# Patient Record
Sex: Male | Born: 1975 | Race: White | Hispanic: No | Marital: Married | Smoking: Never smoker
Health system: Southern US, Community
[De-identification: ages and names within clinical notes are randomized; demographics above are authoritative.]

## PROBLEM LIST (undated history)

## (undated) DIAGNOSIS — Z87442 Personal history of urinary calculi: Secondary | ICD-10-CM

---

## 2017-02-14 ENCOUNTER — Ambulatory Visit (INDEPENDENT_AMBULATORY_CARE_PROVIDER_SITE_OTHER): Payer: BLUE CROSS/BLUE SHIELD | Admitting: Orthopaedic Surgery

## 2017-02-14 ENCOUNTER — Encounter (INDEPENDENT_AMBULATORY_CARE_PROVIDER_SITE_OTHER): Payer: Self-pay | Admitting: Orthopaedic Surgery

## 2017-02-14 ENCOUNTER — Ambulatory Visit (INDEPENDENT_AMBULATORY_CARE_PROVIDER_SITE_OTHER): Payer: BLUE CROSS/BLUE SHIELD

## 2017-02-14 VITALS — BP 151/104 | HR 77

## 2017-02-14 DIAGNOSIS — M5441 Lumbago with sciatica, right side: Secondary | ICD-10-CM | POA: Diagnosis not present

## 2017-02-14 NOTE — Progress Notes (Addendum)
Office Visit Note   Patient: Nathan Cortez           Date of Birth: 1975/06/25           MRN: [id number] Visit Date: 02/14/2017              Requested by: No referring provider defined for this encounter. PCP: Nathan MartinsHubbard, Geramy A, MD   Assessment & Plan: Visit Diagnoses:  1. Acute back pain with sciatica, right     Plan: Patient has back pain with radiculopathy on the right involving the S1 nerve root.  We will place him on Cortez prednisone Dosepak.  Work slip given no work times 2 weeks I plan to check him in 2 weeks.  Follow-Up Instructions: Return in about 2 weeks (around 02/28/2017).   Orders:  Orders Placed This Encounter  Procedures  . XR Lumbar Spine 2-3 Views   No orders of the defined types were placed in this encounter.     Procedures: No procedures performed   Clinical Data: No additional findings.   Subjective: Chief Complaint  Patient presents with  . Lower Back - Pain    HPI 41 year old male [place] with onset of severe back pain right leg pain and weakness that started Thursday evening.  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  Patient states his leg is extremely painful week he has severe pain when he sits he is not able to walk on his toes right foot.  Patient has had some problems with intermittent back aching and soreness but is not had any leg weakness like this since  2013.  Review of Systems  Constitutional: Negative for chills and diaphoresis.  HENT: Negative for ear discharge, ear pain and nosebleeds.   Eyes: Negative for discharge and visual disturbance.  Respiratory: Negative for cough, choking and shortness of breath.   Cardiovascular: Negative for chest pain and palpitations.  Gastrointestinal: Negative for  abdominal distention and abdominal pain.  Endocrine: Negative for cold intolerance and heat intolerance.  Genitourinary: Negative for flank pain and hematuria.  Skin: Negative for rash and wound.  Neurological: Negative for seizures and speech difficulty.  Hematological: Negative for adenopathy. Does not bruise/bleed easily.  Psychiatric/Behavioral: Negative for agitation and suicidal ideas.   review of systems positive for bronchitis he is Cortez non-smoker.  Past history of epidural injections back in 2013 x 2 with good relief.  Patient denies any fever or chills no bowel or bladder associated symptoms.   Objective: Vital Signs: BP (!) 151/104   Pulse 77   Physical Exam  Constitutional: He is oriented to person, place, and time. He appears well-developed and well-nourished.  HENT:  Head: Normocephalic and atraumatic.  Eyes: EOM are normal. Pupils are equal, round, and reactive to light.  Neck: No tracheal deviation present. No thyromegaly present.  Cardiovascular: Normal rate.  Pulmonary/Chest: Effort normal. He has no wheezes.  Abdominal: Soft. Bowel sounds are normal.  Neurological: He is alert and oriented to person, place, and time.  Skin: Skin is warm and dry. Capillary refill takes less than 2 seconds.  Psychiatric: He has Cortez normal mood and affect. His behavior is normal. Judgment and thought content normal.    Ortho Exam positive straight leg raising  Right at  30 degrees.  Normal internal/external rotation of his right hip positive sciatic notch tenderness.  He is unable to do Cortez single stance toe raise on the right.  He can heel walk both sides easily negative straight leg raising.  Anterior tib EHL is strong.  Her peroneal is weak on the right as well as gastrocsoleus without atrophy.  Distal pulses are 2+.  Specialty Comments:  No specialty comments available.  Imaging: Xr Lumbar Spine 2-3 Views  Result Date: 02/14/2017 AP lateral lumbar x-ray obtained and reviewed.  This  shows 2 mm retrolisthesis at L5-S1.  No pars defect negative for acute changes.  Pelvis and hip joints are normal. Impression: Normal alignment other than slight retrolisthesis L5-S1    PMFS History: Patient Active Problem List   Diagnosis Date Noted  . Acute back pain with sciatica, right 02/14/2017   History reviewed. No pertinent past medical history.  History reviewed. No pertinent family history.  History reviewed. No pertinent surgical history. Social History   Occupational History  . Not on file  Tobacco Use  . Smoking status: Never Smoker  . Smokeless tobacco: Never Used  Substance and Sexual Activity  . Alcohol use: Not on file  . Drug use: Not on file  . Sexual activity: Not on file

## 2017-02-20 ENCOUNTER — Telehealth (INDEPENDENT_AMBULATORY_CARE_PROVIDER_SITE_OTHER): Payer: Self-pay | Admitting: Orthopaedic Surgery

## 2017-02-20 DIAGNOSIS — M5441 Lumbago with sciatica, right side: Secondary | ICD-10-CM

## 2017-02-20 NOTE — Telephone Encounter (Signed)
Patient called saying he is still in a lot of pain and was wondering if he should get some more steroid patches or what his other options were. CB # [phone]

## 2017-02-20 NOTE — Telephone Encounter (Signed)
Order entered

## 2017-02-20 NOTE — Telephone Encounter (Signed)
Please advise 

## 2017-02-20 NOTE — Addendum Note (Signed)
Addended by: Rogers SeedsYEATTS, Nicholaos Schippers M on: 02/20/2017 02:23 PM   Modules accepted: Orders

## 2017-02-20 NOTE — Telephone Encounter (Signed)
I called discussed. Still with right calf weakness, cannot walk on right toes.  Proceed with MRI   LBP and right progressive leg weakness S1.   ROV after scan. 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

## 2017-03-01 ENCOUNTER — Ambulatory Visit
Admission: RE | Admit: 2017-03-01 | Discharge: 2017-03-01 | Disposition: A | Discharge status: Home / Self Care | Payer: BLUE CROSS/BLUE SHIELD | Source: Ambulatory Visit | Attending: Orthopaedic Surgery | Admitting: Orthopaedic Surgery

## 2017-03-01 DIAGNOSIS — M5441 Lumbago with sciatica, right side: Secondary | ICD-10-CM

## 2017-03-08 ENCOUNTER — Ambulatory Visit (INDEPENDENT_AMBULATORY_CARE_PROVIDER_SITE_OTHER): Payer: BLUE CROSS/BLUE SHIELD | Admitting: Orthopaedic Surgery

## 2017-03-08 ENCOUNTER — Encounter (INDEPENDENT_AMBULATORY_CARE_PROVIDER_SITE_OTHER): Payer: Self-pay | Admitting: Orthopaedic Surgery

## 2017-03-08 VITALS — BP 145/103 | HR 74

## 2017-03-08 DIAGNOSIS — M5126 Other intervertebral disc displacement, lumbar region: Secondary | ICD-10-CM

## 2017-03-08 NOTE — Progress Notes (Signed)
Office Visit Note   Patient: Nathan Cortez           Date of Birth: [birth year]           MRN: [id number] Visit Date: 03/08/2017              Requested by: Waneta MartinsHubbard, Ra A, MD [address] [place], [place] [address] PCP: Waneta MartinsHubbard, Jeramiah A, MD   Assessment & Plan: Visit Diagnoses:  1. Protrusion of lumbar intervertebral disc     Plan: We will set him up for an epidural injection on the right at the bottom 2 lumbar regions where he is having disc protrusion.  We discussed the facet arthropathy present at the bottom 2 levels in the mild stenosis that is present.  Work slip given for continued out of work and I will recheck him after the epidural injections.  Pathophysiology of disc degeneration discussed questions were elicited and answered.  Patient has radiculopathy with S1 nerve compression on the right consistent with his scan and should get good relief with epidural.  Office follow-up after the epidural.  Follow-Up Instructions: No Follow-up on file.   Orders:  No orders of the defined types were placed in this encounter.  No orders of the defined types were placed in this encounter.     Procedures: No procedures performed   Clinical Data: No additional findings.   Subjective: Chief Complaint  Patient presents with  . 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  He has had significant back pain and right leg symptoms for 4 weeks.  His wife came with him today.  He took some DayQuil this morning for cold has some elevation of his blood pressure at 145/103.  Prednisone pack gave him some improvement after about a week.  He thinks he is about 50% improved.  He has remained out of work to do his back and leg symptoms.  Review of Systems view of systems updated unchanged from 02/15/1999 1814 point, otherwise as mentioned in HPI.   Objective: Vital Signs: BP  (!) 145/103   Pulse 74   Physical Exam  Constitutional: He is oriented to person, place, and time. He appears well-developed and well-nourished.  HENT:  Head: Normocephalic and atraumatic.  Eyes: EOM are normal. Pupils are equal, round, and reactive to light.  Neck: No tracheal deviation present. No thyromegaly present.  Cardiovascular: Normal rate.  Pulmonary/Chest: Effort normal. He has no wheezes.  Abdominal: Soft. Bowel sounds are normal.  Neurological: He is alert and oriented to person, place, and time.  Skin: Skin is warm and dry. Capillary refill takes less than 2 seconds.  Psychiatric: He has a normal mood and affect. His behavior is normal. Judgment and thought content normal.    Ortho Exam positive straight leg raising at 70 degrees on the right negative on the left.  Negative logroll the hips.  Quads are strong.  He has weakness with single stance toe raises on the right none on the left.  He is able to walk across on his toes and can heel walk.  Anterior tib EHL is strong.  Peroneal weakness on the right side only 1 grade.  Decreased sensation lateral foot and plantar surface of the right foot.  Specialty Comments:  No specialty comments available.  Imaging: CLINICAL DATA:  Back pain 6 weeks.  Right foot numbness for 2 weeks.  EXAM: MRI LUMBAR SPINE WITHOUT CONTRAST  TECHNIQUE: Multiplanar, multisequence MR imaging of the lumbar spine was performed. No intravenous contrast was administered.  COMPARISON:  None.  FINDINGS: Segmentation:  Standard.  Alignment:  Physiologic.  Vertebrae:  No fracture, evidence of discitis, or bone lesion.  Conus medullaris and cauda equina: Conus extends to the T12 level. Conus and cauda equina appear normal.  Paraspinal and other soft tissues: No paraspinal abnormality.  Disc levels:  Disc spaces: Disc desiccation at L4-5 and L5-S1.  T12-L1: No significant disc bulge. No evidence of neural foraminal stenosis. No  central canal stenosis.  L1-L2: No significant disc bulge. No evidence of neural foraminal stenosis. No central canal stenosis.  L2-L3: No significant disc bulge. No evidence of neural foraminal stenosis. No central canal stenosis.  L3-L4: No significant disc bulge. No evidence of neural foraminal stenosis. No central canal stenosis. Mild bilateral facet arthropathy.  L4-L5: Broad-based disc bulge with a broad central disc protrusion. Bilateral lateral recess stenosis. Mild bilateral facet arthropathy. Mild spinal stenosis. No evidence of neural foraminal stenosis.  L5-S1: Broad-based disc bulge with a right paracentral disc protrusion with mass effect on the right intraspinal S1 nerve root. Moderate right foraminal stenosis. Moderate left foraminal stenosis. No central canal stenosis.  IMPRESSION: 1. At L5-S1 there is a broad-based disc bulge with a right paracentral disc protrusion with mass effect on the right intraspinal S1 nerve root. Moderate right foraminal stenosis. Moderate left foraminal stenosis. 2. At L4-5 there is a broad-based disc bulge with a broad central disc protrusion. Bilateral lateral recess stenosis. Mild bilateral facet arthropathy. Mild spinal stenosis.   Electronically Signed   By: Elige Ko   On: 03/01/2017 08:44    PMFS History: Patient Active Problem List   Diagnosis Date Noted  . Acute back pain with sciatica, right 02/14/2017   History reviewed. No pertinent past medical history.  History reviewed. No pertinent family history.  History reviewed. No pertinent surgical history. Social History   Occupational History  . Not on file  Tobacco Use  . Smoking status: Never Smoker  . Smokeless tobacco: Never Used  Substance and Sexual Activity  . Alcohol use: Not on file  . Drug use: Not on file  . Sexual activity: Not on file

## 2017-03-14 ENCOUNTER — Ambulatory Visit (INDEPENDENT_AMBULATORY_CARE_PROVIDER_SITE_OTHER): Payer: BLUE CROSS/BLUE SHIELD | Admitting: Orthopaedic Surgery

## 2017-03-21 ENCOUNTER — Ambulatory Visit (INDEPENDENT_AMBULATORY_CARE_PROVIDER_SITE_OTHER): Payer: BLUE CROSS/BLUE SHIELD

## 2017-03-21 ENCOUNTER — Ambulatory Visit (INDEPENDENT_AMBULATORY_CARE_PROVIDER_SITE_OTHER): Payer: BLUE CROSS/BLUE SHIELD | Admitting: Physical Medicine and Rehabilitation

## 2017-03-21 VITALS — BP 147/110 | HR 79 | Temp 98.0°F

## 2017-03-21 DIAGNOSIS — M5116 Intervertebral disc disorders with radiculopathy, lumbar region: Secondary | ICD-10-CM

## 2017-03-21 DIAGNOSIS — M5416 Radiculopathy, lumbar region: Secondary | ICD-10-CM | POA: Diagnosis not present

## 2017-03-21 MED ORDER — BETAMETHASONE SOD PHOS & ACET 6 (3-3) MG/ML IJ SUSP
12.0000 mg | Freq: Once | INTRAMUSCULAR | Status: AC
  Administered 2017-03-21: 12 mg

## 2017-03-21 NOTE — Patient Instructions (Signed)

## 2017-03-21 NOTE — Procedures (Signed)
[name] is a 42 year old gentleman that I saw in 2014 for a right L4-5 interlaminar epidural injection that did seem to help at the time.  He has been followed most recently by Dr. Ophelia Charter with worsening right-sided low back pain and pain referred to the buttock and hamstring.  He gets some numbness and tingling particularly in S1 distribution on the right.  New MRI which is reviewed below does show paracentral disc herniation at L5-S1 likely impacting the S1 nerve root.  He also has lateral recess narrowing at L L4-5.  We will complete a diagnostic and hopefully therapeutic S1 transforaminal injection on the right.  Lumbosacral Transforaminal Epidural Steroid Injection - Sub-Pedicular Approach with Fluoroscopic Guidance  Patient: Nathan Cortez      Date of Birth: 17-Jan-1976 MRN: [id number] PCP: Waneta Martins, MD      Visit Date: 03/21/2017   Universal Protocol:    Date/Time: 03/21/2017  Consent Given By: the patient  Position: PRONE  Additional Comments: Vital signs were monitored before and after the procedure. Patient was prepped and draped in the usual sterile fashion. The correct patient, procedure, and site was verified.   Injection Procedure Details:  Procedure Site One Meds Administered:  Meds ordered this encounter  Medications  . betamethasone acetate-betamethasone sodium phosphate (CELESTONE) injection 12 mg    Laterality: Right  Location/Site:  S1-2  Needle size: 22 G  Needle type: Spinal  Needle Placement: Transforaminal  Findings:    -Comments: Excellent flow of contrast along the nerve and into the epidural space.  Procedure Details: After squaring off the end-plates to get a true AP view, the C-arm was positioned so that an oblique view of the foramen as noted above was visualized. The target area is just inferior to the "nose of the scotty dog" or sub pedicular. The soft tissues overlying this structure were infiltrated with 2-3 ml. of 1%  Lidocaine without Epinephrine.  The spinal needle was inserted toward the target using a "trajectory" view along the fluoroscope beam.  Under AP and lateral visualization, the needle was advanced so it did not puncture dura and was located close the 6 O'Clock position of the pedical in AP tracterory. Biplanar projections were used to confirm position. Aspiration was confirmed to be negative for CSF and/or blood. A 1-2 ml. volume of Isovue-250 was injected and flow of contrast was noted at each level. Radiographs were obtained for documentation purposes.   After attaining the desired flow of contrast documented above, a 0.5 to 1.0 ml test dose of 0.25% Marcaine was injected into each respective transforaminal space.  The patient was observed for 90 seconds post injection.  After no sensory deficits were reported, and normal lower extremity motor function was noted,   the above injectate was administered so that equal amounts of the injectate were placed at each foramen (level) into the transforaminal epidural space.   Additional Comments:  The patient tolerated the procedure well Dressing: Band-Aid    Post-procedure details: Patient was observed during the procedure. Post-procedure instructions were reviewed.  Patient left the clinic in stable condition.   Pertinent Imaging: MRI LUMBAR SPINE WITHOUT CONTRAST  TECHNIQUE: Multiplanar, multisequence MR imaging of the lumbar spine was performed. No intravenous contrast was administered.  COMPARISON:  None.  FINDINGS: Segmentation:  Standard.  Alignment:  Physiologic.  Vertebrae:  No fracture, evidence of discitis, or bone lesion.  Conus medullaris and cauda equina: Conus extends to the T12 level. Conus and cauda equina appear normal.  Paraspinal and other soft tissues: No paraspinal abnormality.  Disc levels:  Disc spaces: Disc desiccation at L4-5 and L5-S1.  T12-L1: No significant disc bulge. No evidence of neural  foraminal stenosis. No central canal stenosis.  L1-L2: No significant disc bulge. No evidence of neural foraminal stenosis. No central canal stenosis.  L2-L3: No significant disc bulge. No evidence of neural foraminal stenosis. No central canal stenosis.  L3-L4: No significant disc bulge. No evidence of neural foraminal stenosis. No central canal stenosis. Mild bilateral facet arthropathy.  L4-L5: Broad-based disc bulge with a broad central disc protrusion. Bilateral lateral recess stenosis. Mild bilateral facet arthropathy. Mild spinal stenosis. No evidence of neural foraminal stenosis.  L5-S1: Broad-based disc bulge with a right paracentral disc protrusion with mass effect on the right intraspinal S1 nerve root. Moderate right foraminal stenosis. Moderate left foraminal stenosis. No central canal stenosis.  IMPRESSION: 1. At L5-S1 there is a broad-based disc bulge with a right paracentral disc protrusion with mass effect on the right intraspinal S1 nerve root. Moderate right foraminal stenosis. Moderate left foraminal stenosis. 2. At L4-5 there is a broad-based disc bulge with a broad central disc protrusion. Bilateral lateral recess stenosis. Mild bilateral facet arthropathy. Mild spinal stenosis.   Electronically Signed   By: Elige KoHetal  Patel   On: 03/01/2017 08:44

## 2017-03-21 NOTE — Progress Notes (Deleted)
Saw in 2014 for right L4-5 interlaminar esi

## 2017-03-21 NOTE — Progress Notes (Deleted)
Patient here today with right sided low back pain. States he doesn't have as much pain as he did a few weeks ago, but quite a bit of numbness/weakness in the right leg. 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 He not allergic to anything, nor does he take a blood thinner.

## 2017-03-28 ENCOUNTER — Ambulatory Visit (INDEPENDENT_AMBULATORY_CARE_PROVIDER_SITE_OTHER): Payer: BLUE CROSS/BLUE SHIELD | Admitting: Orthopaedic Surgery

## 2017-03-28 ENCOUNTER — Encounter (INDEPENDENT_AMBULATORY_CARE_PROVIDER_SITE_OTHER): Payer: Self-pay | Admitting: Orthopaedic Surgery

## 2017-03-28 VITALS — BP 150/99 | HR 83 | Ht 71.0 in | Wt 220.0 lb

## 2017-03-28 DIAGNOSIS — M5441 Lumbago with sciatica, right side: Secondary | ICD-10-CM | POA: Diagnosis not present

## 2017-03-28 NOTE — Progress Notes (Signed)
Office Visit Note   Patient: Nathan BaleStephen Matthew Cortez           Date of Birth: [birth year]           MRN: [id number] Visit Date: 03/28/2017              Requested by: Nathan Cortez, Nathan A, MD [address] [place], [place] [address] PCP: Nathan Cortez, Nathan A, MD   Assessment & Plan: Visit Diagnoses:  1. Acute back pain with sciatica, right     Plan: Work slip given for work resumption on 04/01/2017.  I plan to recheck him in 6 weeks.  If he has recurrence of his leg pain and weakness he will call.  We again reviewed the MRI scan report as well as images.  We discussed options for treatment including surgical indications.  He is improved after the injection and we will check him in 6 weeks.  Follow-Up Instructions: No Follow-up on file.   Orders:  No orders of the defined types were placed in this encounter.  No orders of the defined types were placed in this encounter.     Procedures: No procedures performed   Clinical Data: No additional findings.   Subjective: Chief Complaint  Patient presents with  . Lower Back - Pain, Follow-up    Post ESI    HPI 42 year old male returns post epidural injection sub-pedicular for L5-S1 disc protrusion with radiculopathy.  He states he is about 95% better.  He still has some back soreness and stiffness and had L4-5 and L5-S1 disc degeneration with broad-based disc bulge and some narrowing at L4-5 and paracentral disc protrusion with mass-effect on the right S1 nerve root.  Moderate foraminal stenosis was present.  He states his leg strength is improved he is moving better he still has some numbness in his foot and leg as expected.  Review of Systems 14 point review of systems updated unchanged from last office visit on 03/08/2017 other than as mentioned in HPI.   Objective: Vital Signs: BP (!) 150/99   Pulse 83   Ht 5\' 11"  (1.803 m)   Wt 220 lb (99.8 kg)   BMI 30.68 kg/m   Physical Exam  Constitutional: He is oriented to person,  place, and time. He appears well-developed and well-nourished.  HENT:  Head: Normocephalic and atraumatic.  Eyes: EOM are normal. Pupils are equal, round, and reactive to light.  Neck: No tracheal deviation present. No thyromegaly present.  Cardiovascular: Normal rate.  Pulmonary/Chest: Effort normal. He has no wheezes.  Abdominal: Soft. Bowel sounds are normal.  Neurological: He is alert and oriented to person, place, and time.  Skin: Skin is warm and dry. Capillary refill takes less than 2 seconds.  Psychiatric: He has Cortez normal mood and affect. His behavior is normal. Judgment and thought content normal.    Ortho Exam patient is ambulatory without limp.  Mild discomfort straight leg raising at 90 degrees.  He is able to heel and toe walk.  Specialty Comments:  No specialty comments available.  Imaging: No results found.   PMFS History: Patient Active Problem List   Diagnosis Date Noted  . Acute back pain with sciatica, right 02/14/2017   History reviewed. No pertinent past medical history.  History reviewed. No pertinent family history.  History reviewed. No pertinent surgical history. Social History   Occupational History  . Not on file  Tobacco Use  . Smoking status: Never Smoker  . Smokeless tobacco: Never Used  Substance  and Sexual Activity  . Alcohol use: Not on file  . Drug use: Not on file  . Sexual activity: Not on file

## 2017-05-11 ENCOUNTER — Telehealth (INDEPENDENT_AMBULATORY_CARE_PROVIDER_SITE_OTHER): Payer: Self-pay | Admitting: Orthopaedic Surgery

## 2017-05-11 ENCOUNTER — Telehealth (INDEPENDENT_AMBULATORY_CARE_PROVIDER_SITE_OTHER): Payer: Self-pay

## 2017-05-11 NOTE — Telephone Encounter (Signed)
[facility] [address] [place]    Prednisone 10 mg dose pack # 21 tabs call in please. thanks

## 2017-05-11 NOTE — Telephone Encounter (Signed)
Message sent in error

## 2017-05-11 NOTE — Telephone Encounter (Signed)
Scheduled for 05/30/17 at 1500 with driver and no blood thinners.

## 2017-05-11 NOTE — Telephone Encounter (Signed)
Patient called stating that he is quite a bit of pain and wanted to know if Dr. Ophelia CharterYates needed to see him prior to receiving the injection by Dr. Alvester MorinNewton.  His first available was not until the 26th of March.  CB#[phone]

## 2017-05-11 NOTE — Telephone Encounter (Signed)
Please advise. Patient had called earlier today and spoke with Amy wanting to be set up for second epidural.  Next available is 3/26.

## 2017-05-11 NOTE — Telephone Encounter (Signed)
If same yes

## 2017-05-11 NOTE — Telephone Encounter (Signed)
Pt had Rt L5& S1 TF on 03/21/17 and had relief for several weeks but has had increased pain x 2 weeks and wants to know if he can be scheduled for another. Please advise

## 2017-05-12 ENCOUNTER — Telehealth (INDEPENDENT_AMBULATORY_CARE_PROVIDER_SITE_OTHER): Payer: Self-pay | Admitting: Orthopaedic Surgery

## 2017-05-12 MED ORDER — PREDNISONE 10 MG (21) PO TBPK: ORAL_TABLET | ORAL | 0 refills | Status: DC

## 2017-05-12 NOTE — Telephone Encounter (Signed)
Sent to pharmacy. I called patient and advised. 

## 2017-05-12 NOTE — Telephone Encounter (Signed)
Script sent to pharmacy. I called and advised patient.  He states that he is ready to have the surgery. The pain has gotten that bad. He would like to go ahead and schedule. Please advise.

## 2017-05-12 NOTE — Telephone Encounter (Signed)
Blue sheet done for right L4-5 and right L5-S1 microdiscectomy. Call pt about scheduling please.

## 2017-05-12 NOTE — Telephone Encounter (Signed)
Patient called left voicemail message advised Dr Ophelia CharterYates was going to call in a Prednisone pack for him. Patient [facility] [address] The number to contact patient is [phone]

## 2017-05-15 ENCOUNTER — Other Ambulatory Visit: Payer: Self-pay

## 2017-05-15 ENCOUNTER — Encounter (HOSPITAL_COMMUNITY): Payer: Self-pay | Admitting: *Deleted

## 2017-05-16 NOTE — Anesthesia Preprocedure Evaluation (Addendum)
Anesthesia Evaluation  Patient identified by MRN, date of birth, ID band Patient awake    Reviewed: Allergy & Precautions, NPO status , Patient's Chart, lab work & pertinent test results  Airway Mallampati: II  TM Distance: >3 FB Neck ROM: Full    Dental no notable dental hx.    Pulmonary neg pulmonary ROS,    Pulmonary exam normal breath sounds clear to auscultation       Cardiovascular negative cardio ROS Normal cardiovascular exam Rhythm:Regular Rate:Normal     Neuro/Psych negative neurological ROS  negative psych ROS   GI/Hepatic negative GI ROS, Neg liver ROS,   Endo/Other  negative endocrine ROS  Renal/GU negative Renal ROS     Musculoskeletal negative musculoskeletal ROS (+)   Abdominal   Peds  Hematology negative hematology ROS (+)   Anesthesia Other Findings   Reproductive/Obstetrics negative OB ROS                             Anesthesia Physical Anesthesia Plan  ASA: II  Anesthesia Plan: General   Post-op Pain Management:    Induction: Intravenous  PONV Risk Score and Plan: 3 and Ondansetron, Dexamethasone and Midazolam  Airway Management Planned: Oral ETT  Additional Equipment:   Intra-op Plan:   Post-operative Plan: Extubation in OR  Informed Consent: I have reviewed the patients History and Physical, chart, labs and discussed the procedure including the risks, benefits and alternatives for the proposed anesthesia with the patient or authorized representative who has indicated his/her understanding and acceptance.   Dental advisory given  Plan Discussed with: CRNA  Anesthesia Plan Comments:         Anesthesia Quick Evaluation

## 2017-05-17 ENCOUNTER — Ambulatory Visit (HOSPITAL_COMMUNITY)
Admission: RE | Disposition: A | Discharge status: Home / Self Care | Payer: Self-pay | Source: Ambulatory Visit | Attending: Orthopaedic Surgery

## 2017-05-17 ENCOUNTER — Ambulatory Visit (HOSPITAL_COMMUNITY): Payer: BLUE CROSS/BLUE SHIELD

## 2017-05-17 ENCOUNTER — Encounter (HOSPITAL_COMMUNITY): Payer: Self-pay | Admitting: *Deleted

## 2017-05-17 ENCOUNTER — Ambulatory Visit (HOSPITAL_COMMUNITY): Payer: BLUE CROSS/BLUE SHIELD | Admitting: Anesthesiology

## 2017-05-17 ENCOUNTER — Observation Stay (HOSPITAL_COMMUNITY)
Admission: RE | Admit: 2017-05-17 | Discharge: 2017-05-18 | Disposition: A | Discharge status: Home / Self Care | Payer: BLUE CROSS/BLUE SHIELD | Source: Ambulatory Visit | Attending: Orthopaedic Surgery | Admitting: Orthopaedic Surgery

## 2017-05-17 DIAGNOSIS — Z419 Encounter for procedure for purposes other than remedying health state, unspecified: Secondary | ICD-10-CM

## 2017-05-17 DIAGNOSIS — M5126 Other intervertebral disc displacement, lumbar region: Secondary | ICD-10-CM | POA: Diagnosis present

## 2017-05-17 DIAGNOSIS — M5116 Intervertebral disc disorders with radiculopathy, lumbar region: Secondary | ICD-10-CM | POA: Diagnosis present

## 2017-05-17 DIAGNOSIS — M5127 Other intervertebral disc displacement, lumbosacral region: Secondary | ICD-10-CM | POA: Insufficient documentation

## 2017-05-17 DIAGNOSIS — M5106 Intervertebral disc disorders with myelopathy, lumbar region: Secondary | ICD-10-CM | POA: Diagnosis not present

## 2017-05-17 HISTORY — DX: Personal history of urinary calculi: Z87.442

## 2017-05-17 HISTORY — PX: LUMBAR LAMINECTOMY/DECOMPRESSION MICRODISCECTOMY: SHX5026

## 2017-05-17 LAB — COMPREHENSIVE METABOLIC PANEL
ALK PHOS: 41 U/L (ref 38–126)
ALT: 16 U/L — ABNORMAL LOW (ref 17–63)
ANION GAP: 11 (ref 5–15)
AST: 19 U/L (ref 15–41)
Albumin: 4.2 g/dL (ref 3.5–5.0)
BUN: 14 mg/dL (ref 6–20)
CALCIUM: 8.8 mg/dL — AB (ref 8.9–10.3)
CO2: 20 mmol/L — AB (ref 22–32)
CREATININE: 0.95 mg/dL (ref 0.61–1.24)
Chloride: 107 mmol/L (ref 101–111)
Glucose, Bld: 98 mg/dL (ref 65–99)
Potassium: 3.8 mmol/L (ref 3.5–5.1)
SODIUM: 138 mmol/L (ref 135–145)
TOTAL PROTEIN: 6.9 g/dL (ref 6.5–8.1)
Total Bilirubin: 0.5 mg/dL (ref 0.3–1.2)

## 2017-05-17 LAB — CBC
HCT: 44.6 % (ref 39.0–52.0)
HEMOGLOBIN: 15.8 g/dL (ref 13.0–17.0)
MCH: 30.7 pg (ref 26.0–34.0)
MCHC: 35.4 g/dL (ref 30.0–36.0)
MCV: 86.8 fL (ref 78.0–100.0)
Platelets: 242 10*3/uL (ref 150–400)
RBC: 5.14 MIL/uL (ref 4.22–5.81)
RDW: 11.9 % (ref 11.5–15.5)
WBC: 5.1 10*3/uL (ref 4.0–10.5)

## 2017-05-17 SURGERY — LUMBAR LAMINECTOMY/DECOMPRESSION MICRODISCECTOMY 2 LEVELS
Anesthesia: General | Site: Back

## 2017-05-17 MED ORDER — LACTATED RINGERS IV SOLN
INTRAVENOUS | Status: DC | PRN
  Administered 2017-05-17 (×2): via INTRAVENOUS

## 2017-05-17 MED ORDER — THROMBIN 20000 UNITS EX SOLR
CUTANEOUS | Status: AC
  Filled 2017-05-17: qty 20000

## 2017-05-17 MED ORDER — SODIUM CHLORIDE 0.9 % IV SOLN: 250.0000 mL | INTRAVENOUS | Status: DC

## 2017-05-17 MED ORDER — HYDROMORPHONE HCL 1 MG/ML IJ SOLN
0.5000 mg | INTRAMUSCULAR | Status: DC | PRN
  Administered 2017-05-17 – 2017-05-18 (×2): 0.5 mg via INTRAVENOUS
  Filled 2017-05-17 (×2): qty 0.5

## 2017-05-17 MED ORDER — THROMBIN 5000 UNITS EX SOLR
CUTANEOUS | Status: AC
  Filled 2017-05-17: qty 5000

## 2017-05-17 MED ORDER — PROMETHAZINE HCL 25 MG/ML IJ SOLN: 6.2500 mg | INTRAMUSCULAR | Status: DC | PRN

## 2017-05-17 MED ORDER — OXYCODONE HCL 5 MG PO TABS: 5.0000 mg | ORAL_TABLET | Freq: Once | ORAL | Status: DC | PRN

## 2017-05-17 MED ORDER — LIDOCAINE HCL (CARDIAC) 20 MG/ML IV SOLN
INTRAVENOUS | Status: DC | PRN
  Administered 2017-05-17: 100 mg via INTRAVENOUS

## 2017-05-17 MED ORDER — OXYCODONE HCL 5 MG/5ML PO SOLN: 5.0000 mg | Freq: Once | ORAL | Status: DC | PRN

## 2017-05-17 MED ORDER — OXYCODONE HCL 5 MG PO TABS
5.0000 mg | ORAL_TABLET | ORAL | Status: DC | PRN
  Administered 2017-05-17 – 2017-05-18 (×3): 5 mg via ORAL
  Filled 2017-05-17 (×3): qty 1

## 2017-05-17 MED ORDER — SODIUM CHLORIDE 0.9% FLUSH: 3.0000 mL | Freq: Two times a day (BID) | INTRAVENOUS | Status: DC

## 2017-05-17 MED ORDER — FENTANYL CITRATE (PF) 100 MCG/2ML IJ SOLN
INTRAMUSCULAR | Status: DC | PRN
  Administered 2017-05-17 (×2): 50 ug via INTRAVENOUS
  Administered 2017-05-17: 100 ug via INTRAVENOUS
  Administered 2017-05-17: 50 ug via INTRAVENOUS

## 2017-05-17 MED ORDER — LACTATED RINGERS IV SOLN
INTRAVENOUS | Status: DC
  Administered 2017-05-17: 11:00:00 via INTRAVENOUS

## 2017-05-17 MED ORDER — MIDAZOLAM HCL 2 MG/2ML IJ SOLN
INTRAMUSCULAR | Status: AC
  Filled 2017-05-17: qty 2

## 2017-05-17 MED ORDER — MIDAZOLAM HCL 5 MG/5ML IJ SOLN
INTRAMUSCULAR | Status: DC | PRN
  Administered 2017-05-17: 2 mg via INTRAVENOUS

## 2017-05-17 MED ORDER — SODIUM CHLORIDE 0.9% FLUSH: 3.0000 mL | INTRAVENOUS | Status: DC | PRN

## 2017-05-17 MED ORDER — THROMBIN (RECOMBINANT) 5000 UNITS EX SOLR
OROMUCOSAL | Status: DC | PRN
  Administered 2017-05-17: 5 mL via TOPICAL

## 2017-05-17 MED ORDER — ACETAMINOPHEN 325 MG PO TABS
650.0000 mg | ORAL_TABLET | ORAL | Status: DC | PRN
  Filled 2017-05-17: qty 2

## 2017-05-17 MED ORDER — ONDANSETRON HCL 4 MG/2ML IJ SOLN
INTRAMUSCULAR | Status: DC | PRN
  Administered 2017-05-17: 4 mg via INTRAVENOUS

## 2017-05-17 MED ORDER — HYDROMORPHONE HCL 1 MG/ML IJ SOLN
INTRAMUSCULAR | Status: AC
  Filled 2017-05-17: qty 1

## 2017-05-17 MED ORDER — CHLORHEXIDINE GLUCONATE 4 % EX LIQD: 60.0000 mL | Freq: Once | CUTANEOUS | Status: DC

## 2017-05-17 MED ORDER — DOCUSATE SODIUM 100 MG PO CAPS
100.0000 mg | ORAL_CAPSULE | Freq: Two times a day (BID) | ORAL | Status: DC
  Administered 2017-05-17 – 2017-05-18 (×2): 100 mg via ORAL
  Filled 2017-05-17 (×2): qty 1

## 2017-05-17 MED ORDER — ONDANSETRON HCL 4 MG/2ML IJ SOLN: 4.0000 mg | Freq: Four times a day (QID) | INTRAMUSCULAR | Status: DC | PRN

## 2017-05-17 MED ORDER — METHOCARBAMOL 500 MG PO TABS
500.0000 mg | ORAL_TABLET | Freq: Four times a day (QID) | ORAL | Status: DC | PRN
  Administered 2017-05-17 – 2017-05-18 (×3): 500 mg via ORAL
  Filled 2017-05-17 (×3): qty 1

## 2017-05-17 MED ORDER — 0.9 % SODIUM CHLORIDE (POUR BTL) OPTIME
TOPICAL | Status: DC | PRN
  Administered 2017-05-17: 1000 mL

## 2017-05-17 MED ORDER — POLYETHYLENE GLYCOL 3350 17 G PO PACK: 17.0000 g | PACK | Freq: Every day | ORAL | Status: DC | PRN

## 2017-05-17 MED ORDER — SODIUM CHLORIDE 0.9 % IV SOLN: INTRAVENOUS | Status: DC

## 2017-05-17 MED ORDER — GLYCOPYRROLATE 0.2 MG/ML IJ SOLN
INTRAMUSCULAR | Status: DC | PRN
  Administered 2017-05-17: 0.4 mg via INTRAVENOUS

## 2017-05-17 MED ORDER — METHOCARBAMOL 1000 MG/10ML IJ SOLN: 500.0000 mg | Freq: Four times a day (QID) | INTRAMUSCULAR | Status: DC | PRN

## 2017-05-17 MED ORDER — NEOSTIGMINE METHYLSULFATE 10 MG/10ML IV SOLN
INTRAVENOUS | Status: DC | PRN
  Administered 2017-05-17: 3 mg via INTRAVENOUS

## 2017-05-17 MED ORDER — PHENOL 1.4 % MT LIQD: 1.0000 | OROMUCOSAL | Status: DC | PRN

## 2017-05-17 MED ORDER — CEFAZOLIN SODIUM-DEXTROSE 2-4 GM/100ML-% IV SOLN
2.0000 g | Freq: Three times a day (TID) | INTRAVENOUS | Status: AC
  Administered 2017-05-17 – 2017-05-18 (×2): 2 g via INTRAVENOUS
  Filled 2017-05-17 (×2): qty 100

## 2017-05-17 MED ORDER — FENTANYL CITRATE (PF) 250 MCG/5ML IJ SOLN
INTRAMUSCULAR | Status: AC
  Filled 2017-05-17: qty 5

## 2017-05-17 MED ORDER — ACETAMINOPHEN 650 MG RE SUPP: 650.0000 mg | RECTAL | Status: DC | PRN

## 2017-05-17 MED ORDER — CEFAZOLIN SODIUM-DEXTROSE 2-4 GM/100ML-% IV SOLN
2.0000 g | INTRAVENOUS | Status: AC
  Administered 2017-05-17: 2 g via INTRAVENOUS
  Filled 2017-05-17: qty 100

## 2017-05-17 MED ORDER — DEXAMETHASONE SODIUM PHOSPHATE 10 MG/ML IJ SOLN
INTRAMUSCULAR | Status: DC | PRN
  Administered 2017-05-17: 10 mg via INTRAVENOUS

## 2017-05-17 MED ORDER — ONDANSETRON HCL 4 MG PO TABS: 4.0000 mg | ORAL_TABLET | Freq: Four times a day (QID) | ORAL | Status: DC | PRN

## 2017-05-17 MED ORDER — BUPIVACAINE HCL (PF) 0.25 % IJ SOLN
INTRAMUSCULAR | Status: AC
  Filled 2017-05-17: qty 30

## 2017-05-17 MED ORDER — HYDROMORPHONE HCL 1 MG/ML IJ SOLN
0.2500 mg | INTRAMUSCULAR | Status: DC | PRN
  Administered 2017-05-17 (×4): 0.5 mg via INTRAVENOUS

## 2017-05-17 MED ORDER — ROCURONIUM BROMIDE 100 MG/10ML IV SOLN
INTRAVENOUS | Status: DC | PRN
  Administered 2017-05-17: 20 mg via INTRAVENOUS
  Administered 2017-05-17: 50 mg via INTRAVENOUS

## 2017-05-17 MED ORDER — MEPERIDINE HCL 50 MG/ML IJ SOLN: 6.2500 mg | INTRAMUSCULAR | Status: DC | PRN

## 2017-05-17 MED ORDER — PROPOFOL 10 MG/ML IV BOLUS
INTRAVENOUS | Status: DC | PRN
  Administered 2017-05-17: 200 mg via INTRAVENOUS

## 2017-05-17 MED ORDER — MENTHOL 3 MG MT LOZG: 1.0000 | LOZENGE | OROMUCOSAL | Status: DC | PRN

## 2017-05-17 MED ORDER — PROPOFOL 10 MG/ML IV BOLUS
INTRAVENOUS | Status: AC
  Filled 2017-05-17: qty 40

## 2017-05-17 SURGICAL SUPPLY — 41 items
BUR ROUND FLUTED 4 SOFT TCH (BURR) ×2 IMPLANT
BUR ROUND FLUTED 4MM SOFT TCH (BURR) ×1
CANISTER SUCT 3000ML PPV (MISCELLANEOUS) ×3 IMPLANT
CLOSURE WOUND 1/2 X4 (GAUZE/BANDAGES/DRESSINGS) ×1
DERMABOND ADVANCED (GAUZE/BANDAGES/DRESSINGS) ×2
DERMABOND ADVANCED .7 DNX12 (GAUZE/BANDAGES/DRESSINGS) ×1 IMPLANT
DRAPE MICROSCOPE LEICA (MISCELLANEOUS) ×3 IMPLANT
DRSG MEPILEX BORDER 4X4 (GAUZE/BANDAGES/DRESSINGS) ×3 IMPLANT
DRSG MEPILEX BORDER 4X8 (GAUZE/BANDAGES/DRESSINGS) IMPLANT
DURAPREP 26ML APPLICATOR (WOUND CARE) ×3 IMPLANT
DURASEAL SPINE SEALANT 3ML (MISCELLANEOUS) IMPLANT
ELECT REM PT RETURN 9FT ADLT (ELECTROSURGICAL) ×3
ELECTRODE REM PT RTRN 9FT ADLT (ELECTROSURGICAL) ×1 IMPLANT
GAUZE SPONGE 4X4 12PLY STRL (GAUZE/BANDAGES/DRESSINGS) ×3 IMPLANT
GLOVE BIOGEL PI IND STRL 8 (GLOVE) ×2 IMPLANT
GLOVE BIOGEL PI INDICATOR 8 (GLOVE) ×4
GLOVE ORTHO TXT STRL SZ7.5 (GLOVE) ×6 IMPLANT
GOWN STRL REUS W/ TWL LRG LVL3 (GOWN DISPOSABLE) ×1 IMPLANT
GOWN STRL REUS W/ TWL XL LVL3 (GOWN DISPOSABLE) ×2 IMPLANT
GOWN STRL REUS W/TWL 2XL LVL3 (GOWN DISPOSABLE) ×3 IMPLANT
GOWN STRL REUS W/TWL LRG LVL3 (GOWN DISPOSABLE) ×2
GOWN STRL REUS W/TWL XL LVL3 (GOWN DISPOSABLE) ×4
HEMOSTAT POWDER KIT SURGIFOAM (HEMOSTASIS) ×3 IMPLANT
KIT BASIN OR (CUSTOM PROCEDURE TRAY) ×3 IMPLANT
KIT ROOM TURNOVER OR (KITS) ×3 IMPLANT
NEEDLE SPNL 18GX3.5 QUINCKE PK (NEEDLE) ×3 IMPLANT
NS IRRIG 1000ML POUR BTL (IV SOLUTION) ×3 IMPLANT
PACK LAMINECTOMY ORTHO (CUSTOM PROCEDURE TRAY) ×3 IMPLANT
PAD ARMBOARD 7.5X6 YLW CONV (MISCELLANEOUS) ×6 IMPLANT
PATTIES SURGICAL .5 X.5 (GAUZE/BANDAGES/DRESSINGS) IMPLANT
PATTIES SURGICAL .75X.75 (GAUZE/BANDAGES/DRESSINGS) IMPLANT
STRIP CLOSURE SKIN 1/2X4 (GAUZE/BANDAGES/DRESSINGS) ×2 IMPLANT
SUT VIC AB 1 CTX 36 (SUTURE) ×2
SUT VIC AB 1 CTX36XBRD ANBCTR (SUTURE) ×1 IMPLANT
SUT VIC AB 2-0 CT1 27 (SUTURE) ×2
SUT VIC AB 2-0 CT1 TAPERPNT 27 (SUTURE) ×1 IMPLANT
SUT VIC AB 3-0 X1 27 (SUTURE) ×3 IMPLANT
SYR 20ML ECCENTRIC (SYRINGE) IMPLANT
TOWEL OR 17X24 6PK STRL BLUE (TOWEL DISPOSABLE) ×3 IMPLANT
TOWEL OR 17X26 10 PK STRL BLUE (TOWEL DISPOSABLE) ×3 IMPLANT
WATER STERILE IRR 1000ML POUR (IV SOLUTION) ×3 IMPLANT

## 2017-05-17 NOTE — Op Note (Addendum)
Preop diagnosis: Right L4-5, L5-S1 disc protrusion with radiculopathy  Postop diagnosis: Same  Procedure: Right L5 hemilaminectomy.  L4 laminotomy S1 laminotomy.  Right L4-5 and L5-S1 microdiscectomy with  Lateral recess decompressions. ( 2 level procedure )  Surgeon: Annell GreeningMark Breanah Faddis MD  Assistant: Zonia KiefJames Owens PA-C medically necessary and present for the entire procedure  EBL: Minimal  Anesthesia: General plus Marcaine skin local  Procedure after induction general anesthesia standard prepping and draping patient prone on chest rolls yellow pads underneath the shoulders and on the ulnar nerve X 10 drape was applied over S2 back was prepped with DuraPrep, squared with towels Betadine Steri-Drape applied and laminectomy sheet.  Based on palpable limit landmarks incision was started at the midline at L4 extended to S1.  Subperiosteal dissection on the right side onto the lamina was performed.  A Taylor retractor was placed out on the L5 and Coker clamp placed over the L4-5 interspace and a Penfield #5 in the interlaminar space between L5 and S1 pointing at the disc space.  Crosstable lateral x-ray confirmed appropriate level of sacrum is easily visualized.  Operative microscope had been draped and right hemilaminectomy was performed thinning down the lamina with the bur and then completing it with the 3 mm Kerrison rondure.  Overhanging spurs were debrided.  There was some hypertrophic ligamentum causing lateral recess stenosis which was decompressed at both levels on the right both at the L4-5 level on the L5-S1 level on the right.  L5-S1 disc was visualized first which had larger protrusion.  Annulus was incised in multiple chunks of disc removed with micropituitary straight pituitary up-biting micropituitary.  Epstein curettes were used and side the disc pushing fragments toward the midline into the disc and then removed them with the pituitary.  Disc was flat hockey-stick was able be passed out the foramina  and some overhanging spurs were trimmed back to make sure the nerve root was free.  Axilla the nerve root was free.  Next chunks of ligament were removed in the gutter following up to the L4-5 disc space.  At this level there was an annular tear with right paracentral bulge.  Less degenerative disc material was produced.  Overhanging spurs and chunks of ligament were removed.  The inferior aspect of L4 lamina was removed with a partial laminotomy.  Passage across the midline with high stick was free.  Some Surgi-Flo was placed in the gutter epidural space was dry bipolar cautery been used multiple times with the microscope.  Final running of the gutter with bone removed at the level of pedicle no residual disc compression final passes were made at both disc make sure there are no remaining fragments that were free need to be removed.  Passage anterior to the dura showed there was good decompression.  Copious irrigation closure of the fascia with #1 Vicryl 2-0 Vicryl subtenons tissue skin closure postop dressing and transfer the recovery room.

## 2017-05-17 NOTE — Transfer of Care (Signed)
Immediate Anesthesia Transfer of Care Note  Patient: Nathan Cortez  Procedure(s) Performed: RIGHT Lumbar four-five , RIGHT Lumbar five-Sacrum one , MICRODISCECTOMY (2 LEVEL) Lumbar-four-five, Lumbar five-Sacrum one laminectomy (N/A Back)  Patient Location: PACU  Anesthesia Type:General  Level of Consciousness: awake, alert , oriented and patient cooperative  Airway & Oxygen Therapy: Patient Spontanous Breathing and Patient connected to nasal cannula oxygen  Post-op Assessment: Report given to RN and Post -op Vital signs reviewed and stable  Post vital signs: Reviewed and stable  Last Vitals:  Vitals:   05/17/17 1023  BP: (!) 162/101  Pulse: 73  Resp: 20  Temp: 36.8 C  SpO2: 98%    Last Pain:  Vitals:   05/17/17 1027  TempSrc:   PainSc: 4       Patients Stated Pain Goal: 3 (05/17/17 1027)  Complications: No apparent anesthesia complications

## 2017-05-17 NOTE — H&P (Signed)
Nathan Cortez is an 42 y.o. male.   Chief Complaint: Low back pain and right lower extremity radiculopathy HPI: Patient with history of right right L5-S1 HNP presents for surgical intervention.  Progressively worsening symptoms.  Failed conservative treatment  Past Medical History:  Diagnosis Date  . History of kidney stones     History reviewed. No pertinent surgical history.  History reviewed. No pertinent family history. Social History:  reports that  has never smoked. His smokeless tobacco use includes chew. He reports that he drinks alcohol. He reports that he does not use drugs.  Allergies: No Known Allergies  Medications Prior to Admission  Medication Sig Dispense Refill  . ibuprofen (ADVIL,MOTRIN) 200 MG tablet Take 800 mg by mouth every 6 (six) hours as needed for headache or moderate pain.    . predniSONE (STERAPRED UNI-PAK 21 TAB) 10 MG (21) TBPK tablet Take as directed. 6,5,4,3,2,1 (Patient not taking: Reported on 05/15/2017) 21 tablet 0    Results for orders placed or performed during the hospital encounter of 05/17/17 (from the past 48 hour(s))  CBC     Status: None   Collection Time: 05/17/17 10:34 AM  Result Value Ref Range   WBC 5.1 4.0 - 10.5 K/uL   RBC 5.14 4.22 - 5.81 MIL/uL   Hemoglobin 15.8 13.0 - 17.0 g/dL   HCT 44.6 39.0 - 52.0 %   MCV 86.8 78.0 - 100.0 fL   MCH 30.7 26.0 - 34.0 pg   MCHC 35.4 30.0 - 36.0 g/dL   RDW 11.9 11.5 - 15.5 %   Platelets 242 150 - 400 K/uL    Comment: Performed at [facility], [place] [address], [place], [place] [address]  Comprehensive metabolic panel     Status: Abnormal   Collection Time: 05/17/17 10:34 AM  Result Value Ref Range   Sodium 138 135 - 145 mmol/L   Potassium 3.8 3.5 - 5.1 mmol/L   Chloride 107 101 - 111 mmol/L   CO2 20 (L) 22 - 32 mmol/L   Glucose, Bld 98 65 - 99 mg/dL   BUN 14 6 - 20 mg/dL   Creatinine, Ser 0.95 0.61 - 1.24 mg/dL   Calcium 8.8 (L) 8.9 - 10.3 mg/dL   Total Protein 6.9 6.5 - 8.1  g/dL   Albumin 4.2 3.5 - 5.0 g/dL   AST 19 15 - 41 U/L   ALT 16 (L) 17 - 63 U/L   Alkaline Phosphatase 41 38 - 126 U/L   Total Bilirubin 0.5 0.3 - 1.2 mg/dL   GFR calc non Af Amer >60 >60 mL/min   GFR calc Af Amer >60 >60 mL/min    Comment: (NOTE) The eGFR has been calculated using the CKD EPI equation. This calculation has not been validated in all clinical situations. eGFR's persistently <60 mL/min signify possible Chronic Kidney Disease.    Anion gap 11 5 - 15    Comment: Performed at [facility] [address], [place], [place] [address]   No results found.  Review of Systems  Constitutional: Negative.   HENT: Negative.   Respiratory: Negative.   Cardiovascular: Negative.   Gastrointestinal: Negative.   Genitourinary: Negative.   Musculoskeletal: Positive for back pain.  Skin: Negative.   Neurological: Positive for tingling.  Psychiatric/Behavioral: Negative.     Blood pressure (!) 162/101, pulse 73, temperature 98.2 F (36.8 C), temperature source Oral, resp. rate 20, height 5' 11"  (1.803 m), weight 220 lb (99.8 kg), SpO2 98 %.  Physical Exam  Constitutional: He is oriented to person, place, and time. He appears well-developed. No distress.  HENT:  Head: Normocephalic.  Eyes: EOM are normal. Pupils are equal, round, and reactive to light.  Respiratory: No respiratory distress.  GI: He exhibits no distension.  Musculoskeletal: He exhibits tenderness.  Neurological: He is alert and oriented to person, place, and time.  Skin: Skin is warm and dry.  Psychiatric: He has a normal mood and affect.     Assessment/Plan Right L4-5 and right L5-S1 HNP  We will proceed with right L4-5 and right L5-S1 microdiscectomy as scheduled.  Surgical procedure along with possible risks and complications discussed.  All questions answered.  Benjiman Core, PA-C 05/17/2017, 12:09 PM

## 2017-05-17 NOTE — Interval H&P Note (Signed)
History and Physical Interval Note:  05/17/2017 12:23 PM  Nathan BaleStephen Matthew Delisle  has presented today for surgery, with the diagnosis of RIGHT L4-5 PROTRUSION, Right L5-S1, HERNIATED NUCLEUS PULPOSUS  The various methods of treatment have been discussed with the patient and family. After consideration of risks, benefits and other options for treatment, the patient has consented to  Procedure(s): RIGHT L4-5, RIGHT L5-S1, MICRODISCECTOMY (2 LEVEL) (N/A) as a surgical intervention .  The patient's history has been reviewed, patient examined, no change in status, stable for surgery.  I have reviewed the patient's chart and labs.  Questions were answered to the patient's satisfaction.     Eldred MangesMark C Minnie Shi

## 2017-05-18 ENCOUNTER — Encounter (HOSPITAL_COMMUNITY): Payer: Self-pay | Admitting: Orthopaedic Surgery

## 2017-05-18 DIAGNOSIS — M5116 Intervertebral disc disorders with radiculopathy, lumbar region: Secondary | ICD-10-CM | POA: Diagnosis not present

## 2017-05-18 MED ORDER — OXYCODONE-ACETAMINOPHEN 5-325 MG PO TABS: 1.0000 | ORAL_TABLET | ORAL | 0 refills | Status: AC | PRN

## 2017-05-18 MED ORDER — METHOCARBAMOL 500 MG PO TABS: 500.0000 mg | ORAL_TABLET | Freq: Four times a day (QID) | ORAL | 0 refills | Status: AC | PRN

## 2017-05-18 MED ORDER — OXYCODONE-ACETAMINOPHEN 5-325 MG PO TABS
2.0000 | ORAL_TABLET | ORAL | Status: DC | PRN
  Administered 2017-05-18: 2 via ORAL
  Filled 2017-05-18: qty 2

## 2017-05-18 MED FILL — Thrombin For Soln 5000 Unit: CUTANEOUS | Qty: 5000 | Status: AC

## 2017-05-18 NOTE — Evaluation (Signed)
Physical Therapy Evaluation Patient Details Name: Nathan Cortez MRN: [id number] DOB: 1975/06/24 Today's Date: 05/18/2017   History of Present Illness  Pt is a 42 y/o male now s/p Right L5 hemilaminectomy.  L4 laminotomy S1 laminotomy.  Right L4-5 and L5-S1 microdiscectomy with  Lateral recess decompressions. No significant PMHx  Clinical Impression  Patient presents with mild deficits due to post surgical pain and stiffness.  Able to perform mobility skills with precautions and reinforced education about walking program and maintaining precautions during activity.  Educated he may need formalized therapy prior to back to work if needs help with conditioning and core strengthening and Estate manager/land agent education.  No further PT needs at this time.  Will sign off.    Follow Up Recommendations No PT follow up    Equipment Recommendations  None recommended by PT    Recommendations for Other Services       Precautions / Restrictions Precautions Precautions: Back Precaution Booklet Issued: Yes (comment) Precaution Comments: reviewed with pt during mobility Restrictions Weight Bearing Restrictions: No Other Position/Activity Restrictions: no brace needed       Mobility  Bed Mobility Overal bed mobility: Modified Independent             General bed mobility comments: performed with good technique without using rail and with bed flat  Transfers Overall transfer level: Needs assistance Equipment used: None Transfers: Sit to/from Stand Sit to Stand: Supervision         General transfer comment: S for safety with increased time, pushing up on his legs  Ambulation/Gait Ambulation/Gait assistance: Modified independent (Device/Increase time) Ambulation Distance (Feet): 300 Feet Assistive device: None Gait Pattern/deviations: Step-through pattern;Wide base of support;Decreased stride length     General Gait Details: slightly slower pace, mildly antalgic on  R  Stairs Stairs: Yes Stairs assistance: Supervision Stair Management: One rail Right;Step to pattern;Forwards Number of Stairs: 5 General stair comments: cues for technique  Wheelchair Mobility    Modified Rankin (Stroke Patients Only)       Balance Overall balance assessment: No apparent balance deficits (not formally assessed)                                           Pertinent Vitals/Pain Pain Assessment: Faces Faces Pain Scale: Hurts a little bit Pain Location: back when lifting R leg Pain Descriptors / Indicators: Guarding;Grimacing Pain Intervention(s): Repositioned;Monitored during session    Home Living Family/patient expects to be discharged to:: Private residence Living Arrangements: Spouse/significant other Available Help at Discharge: Family Type of Home: House Home Access: Stairs to enter Entrance Stairs-Rails: Doctor, general practice of Steps: 4-5 Home Layout: One level Home Equipment: None      Prior Function Level of Independence: Independent         Comments: 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     [employer]        Extremity/Trunk Assessment   Upper Extremity Assessment Upper Extremity Assessment: Defer to OT evaluation    Lower Extremity Assessment Lower Extremity Assessment: Overall WFL for tasks assessed    Cervical / Trunk Assessment Cervical / Trunk Assessment: Other exceptions Cervical / Trunk Exceptions: s/p spinal surgery   Communication   Communication: No difficulties  Cognition Arousal/Alertness: Awake/alert Behavior During Therapy: WFL for tasks assessed/performed Overall Cognitive Status: Within Functional Limits for tasks assessed  General Comments      Exercises     Assessment/Plan    PT Assessment Patent does not need any further PT services  PT Problem List         PT Treatment  Interventions      PT Goals (Current goals can be found in the Care Plan section)  Acute Rehab PT Goals Patient Stated Goal: return to work, return to working out  PT Goal Formulation: All assessment and education complete, DC therapy    Frequency     Barriers to discharge        Co-evaluation               AM-PAC PT "6 Clicks" Daily Activity  Outcome Measure Difficulty turning over in bed (including adjusting bedclothes, sheets and blankets)?: A Little Difficulty moving from lying on back to sitting on the side of the bed? : A Little Difficulty sitting down on and standing up from a chair with arms (e.g., wheelchair, bedside commode, etc,.)?: A Lot Help needed moving to and from a bed to chair (including a wheelchair)?: None Help needed walking in hospital room?: None Help needed climbing 3-5 steps with a railing? : A Little 6 Click Score: 19    End of Session   Activity Tolerance: Patient tolerated treatment well Patient left: in bed;with call bell/phone within reach;with family/visitor present   PT Visit Diagnosis: Other abnormalities of gait and mobility (R26.89)    Time: [id number] PT Time Calculation (min) (ACUTE ONLY): 26 min   Charges:   PT Evaluation $PT Eval Low Complexity: 1 Low PT Treatments $Gait Training: 8-22 mins   PT G CodesSheran Lawless:        Cyndi Verna Hamon, [place] CarolinaPT [phone] 05/18/2017   Elray Mcgregorynthia Amiaya Mcneeley 05/18/2017, 9:54 AM

## 2017-05-18 NOTE — Anesthesia Postprocedure Evaluation (Signed)
Anesthesia Post Note  Patient: Nathan BaleStephen Matthew Cortez  Procedure(s) Performed: RIGHT Lumbar four-five , RIGHT Lumbar five-Sacrum one , MICRODISCECTOMY (2 LEVEL) Lumbar-four-five, Lumbar five-Sacrum one laminectomy (N/A Back)     Patient location during evaluation: PACU Anesthesia Type: General Level of consciousness: awake and alert Pain management: pain level controlled Vital Signs Assessment: post-procedure vital signs reviewed and stable Respiratory status: spontaneous breathing, nonlabored ventilation and respiratory function stable Cardiovascular status: blood pressure returned to baseline and stable Postop Assessment: no apparent nausea or vomiting Anesthetic complications: no    Last Vitals:  Vitals:   05/18/17 0400 05/18/17 0749  BP: 136/80 121/80  Pulse: (!) 57 79  Resp: 18 16  Temp: 37.1 C 36.8 C  SpO2: 98% 98%    Last Pain:  Vitals:   05/18/17 0911  TempSrc:   PainSc: 7                  Corene Resnick,W. EDMOND

## 2017-05-18 NOTE — Progress Notes (Signed)
   Subjective: 1 Day Post-Op Procedure(s) (LRB): RIGHT Lumbar four-five , RIGHT Lumbar five-Sacrum one , MICRODISCECTOMY (2 LEVEL) Lumbar-four-five, Lumbar five-Sacrum one laminectomy (N/A) Patient reports pain as mild and moderate.    Objective: Vital signs in last 24 hours: Temp:  [97.3 F (36.3 C)-99 F (37.2 C)] 98.2 F (36.8 C) (03/14 0749) Pulse Rate:  [57-118] 79 (03/14 0749) Resp:  [8-20] 16 (03/14 0749) BP: (121-162)/(78-101) 121/80 (03/14 0749) SpO2:  [94 %-98 %] 98 % (03/14 0749) Weight:  [220 lb (99.8 kg)] 220 lb (99.8 kg) (03/13 1023)  Intake/Output from previous day: 03/13 0701 - 03/14 0700 In: 1750 [I.V.:1450; IV Piggyback:300] Out: 150 [Blood:150] Intake/Output this shift: No intake/output data recorded.  Recent Labs    05/17/17 1034  HGB 15.8   Recent Labs    05/17/17 1034  WBC 5.1  RBC 5.14  HCT 44.6  PLT 242   Recent Labs    05/17/17 1034  NA 138  K 3.8  CL 107  CO2 20*  BUN 14  CREATININE 0.95  GLUCOSE 98  CALCIUM 8.8*   No results for input(s): LABPT, INR in the last 72 hours.  Neurologically intact Dg Lumbar Spine 1 View  Result Date: 05/17/2017 CLINICAL DATA:  Two level microdiscectomy. EXAM: LUMBAR SPINE - 1 VIEW COMPARISON:  03/01/2017. FINDINGS: Cross-table lateral radiograph 1 demonstrates a angled probe inferiorly directed most closely toward L5-S1. There is an additional instrument representing a straight clamp or hemostat more superiorly, directed most closely toward L4-5. IMPRESSION: Intraoperative localization as described. The vertebral bodies have been annotated on the radiograph as correlated with the prior MR, to aid in intraoperative localization. Electronically Signed   By: Elsie StainJohn T Curnes M.D.   On: 05/17/2017 19:28    Assessment/Plan: 1 Day Post-Op Procedure(s) (LRB): RIGHT Lumbar four-five , RIGHT Lumbar five-Sacrum one , MICRODISCECTOMY (2 LEVEL) Lumbar-four-five, Lumbar five-Sacrum one laminectomy (N/A) Plan:  dressing change. Discharge home.   Eldred MangesMark C Duel Conrad 05/18/2017, 7:49 AM

## 2017-05-18 NOTE — Progress Notes (Signed)
Patient alert and oriented, mae's well, voiding adequate amount of urine, swallowing without difficulty, no c/o pain at time of discharge. Patient discharged home with family. Script and discharged instructions given to patient. Patient and family stated understanding of instructions given. Patient has an appointment with Dr. Yates  

## 2017-05-18 NOTE — Evaluation (Addendum)
Occupational Therapy Evaluation Patient Details Name: Nathan Cortez MRN: [id number] DOB: [birth year] Today's Date: 05/18/2017    History of Present Illness Pt is a 42 y/o male now s/p Right L5 hemilaminectomy.  L4 laminotomy S1 laminotomy.  Right L4-5 and L5-S1 microdiscectomy with  Lateral recess decompressions. No significant PMHx   Clinical Impression   This 42 y/o M presents with the above. At baseline Pt is independent with ADLs and functional mobility, works as a IT sales professional. Pt will return home with spouse who pt reports is available to assist with ADLs PRN. Pt completing room and hallway functional mobility without AD and with supervision this session; currently requires minA for LB ADLs secondary to adhering to back precautions. Education provided on back precautions, AE, safety and compensatory techniques for completing ADLs while adhering to precautions with pt verbalizing and return demonstrating understanding; questions answered throughout. Pt reports feeling comfortable completing ADLs after return home with available family assist and AE if needed. No further acute OT needs identified at this time. Will sign off.     Follow Up Recommendations  Follow surgeon's recommendation for DC plan and follow-up therapies;Supervision/Assistance - 24 hour    Equipment Recommendations  None recommended by OT           Precautions / Restrictions Precautions Precautions: Back Precaution Booklet Issued: Yes (comment) Precaution Comments: issued and reviewed with pt/pt spouse  Restrictions Weight Bearing Restrictions: No Other Position/Activity Restrictions: no brace needed       Mobility Bed Mobility               General bed mobility comments: Pt OOB upon entering room, verbally reviewed log roll technique  Transfers Overall transfer level: Needs assistance   Transfers: Sit to/from Stand Sit to Stand: Supervision         General transfer comment: supervision  for safety     Balance Overall balance assessment: No apparent balance deficits (not formally assessed)                                         ADL either performed or assessed with clinical judgement   ADL Overall ADL's : Needs assistance/impaired Eating/Feeding: Modified independent;Sitting   Grooming: Supervision/safety;Standing   Upper Body Bathing: Min guard;Sitting   Lower Body Bathing: Min guard;Sit to/from stand Lower Body Bathing Details (indicate cue type and reason): educated on use of AE for increased ease of reaching LEs during task completion  Upper Body Dressing : Supervision/safety;Sitting   Lower Body Dressing: Minimal assistance;Sit to/from stand Lower Body Dressing Details (indicate cue type and reason): educated on use of reacher to complete LB dressing Toilet Transfer: Supervision/safety;Ambulation;Regular Toilet   Toileting- Clothing Manipulation and Hygiene: Minimal assistance;Sit to/from stand Toileting - Clothing Manipulation Details (indicate cue type and reason): educated on AE for completing peri-care       Functional mobility during ADLs: Supervision/safety General ADL Comments: pt completing functional mobility without AD and with supervision; education provided on back precautions, AE, safety and compensatory techniques for completing ADLs while adhering to precautions with pt verbalizing and return demonstrating understanding                   Pertinent Vitals/Pain Pain Assessment: Faces Faces Pain Scale: Hurts little more Pain Location: back  Pain Descriptors / Indicators: Guarding;Grimacing Pain Intervention(s): Monitored during session     Extremity/Trunk Assessment Upper Extremity Assessment Upper  Extremity Assessment: Overall WFL for tasks assessed   Lower Extremity Assessment Lower Extremity Assessment: Defer to PT evaluation   Cervical / Trunk Assessment Cervical / Trunk Assessment: Other exceptions Cervical  / Trunk Exceptions: s/p spinal surgery    Communication Communication Communication: No difficulties   Cognition Arousal/Alertness: Awake/alert Behavior During Therapy: WFL for tasks assessed/performed Overall Cognitive Status: Within Functional Limits for tasks assessed                                                      Home Living Family/patient expects to be discharged to:: Private residence Living Arrangements: Spouse/significant other Available Help at Discharge: Family Type of Home: House Home Access: Stairs to enter Secretary/administratorntrance Stairs-Number of Steps: 4-5   Home Layout: One level     Bathroom Shower/Tub: Producer, television/film/videoWalk-in shower   Bathroom Toilet: Standard     Home Equipment: None          Prior Functioning/Environment Level of Independence: Independent        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         OT Problem List: Decreased activity tolerance;Decreased knowledge of precautions;Decreased knowledge of use of DME or AE            OT Goals(Current goals can be found in the care plan section) Acute Rehab OT Goals Patient Stated Goal: return to work, return to working out  OT Goal Formulation: All assessment and education complete, DC therapy                                 AM-PAC PT "6 Clicks" Daily Activity     Outcome Measure Help from another person eating meals?: None Help from another person taking care of personal grooming?: None Help from another person toileting, which includes using toliet, bedpan, or urinal?: A Little Help from another person bathing (including washing, rinsing, drying)?: A Little Help from another person to put on and taking off regular upper body clothing?: None Help from another person to put on and taking off regular lower body clothing?: A Little 6 Click Score: 21   End of Session Nurse Communication: Mobility status  Activity Tolerance: Patient tolerated treatment well Patient left: in  bed;with call bell/phone within reach;with family/visitor present  OT Visit Diagnosis: Other abnormalities of gait and mobility (R26.89)                Time: 4098-1191: 0826-0849 OT Time Calculation (min): 23 min Charges:  OT General Charges $OT Visit: 1 Visit OT Evaluation $OT Eval Low Complexity: 1 Low G-Codes:     Nathan Cortez, OT Pager [phone] 05/18/2017   Nathan Cortez 05/18/2017, 9:39 AM

## 2017-05-18 NOTE — Discharge Instructions (Signed)
Walk daily slowly increase to 2 miles. OK to shower. See Dr. Ophelia CharterYates in one to two weeks for followup.

## 2017-05-25 ENCOUNTER — Telehealth (INDEPENDENT_AMBULATORY_CARE_PROVIDER_SITE_OTHER): Payer: Self-pay | Admitting: Physical Medicine and Rehabilitation

## 2017-05-25 ENCOUNTER — Encounter (INDEPENDENT_AMBULATORY_CARE_PROVIDER_SITE_OTHER): Payer: Self-pay | Admitting: Surgery

## 2017-05-25 ENCOUNTER — Ambulatory Visit (INDEPENDENT_AMBULATORY_CARE_PROVIDER_SITE_OTHER): Payer: BLUE CROSS/BLUE SHIELD | Admitting: Surgery

## 2017-05-25 DIAGNOSIS — Z9889 Other specified postprocedural states: Secondary | ICD-10-CM

## 2017-05-25 MED ORDER — HYDROCODONE-ACETAMINOPHEN 10-325 MG PO TABS: 1.0000 | ORAL_TABLET | Freq: Four times a day (QID) | ORAL | 0 refills | Status: AC | PRN

## 2017-05-25 MED ORDER — HYDROCODONE-ACETAMINOPHEN 10-325 MG PO TABS: 1.0000 | ORAL_TABLET | Freq: Four times a day (QID) | ORAL | 0 refills | Status: DC | PRN

## 2017-05-25 NOTE — Progress Notes (Signed)
   Post-Op Visit Note   Patient: Nathan BaleStephen Matthew Strom           Date of Birth: 1975-08-11           MRN: [id number] Visit Date: 05/25/2017 PCP: Waneta MartinsHubbard, Lasean A, MD   Assessment & Plan:  Chief Complaint:  Chief Complaint  Patient presents with  . Lower Back - Routine Post Op  Patient doing well.  Preop leg pain is much improved.  He is pleased.  Does have some soreness as to be expected at this point. Visit Diagnoses:  1. S/P lumbar discectomy     Plan: Patient will follow-up in 5 weeks for recheck.  He understands to avoid bending, twisting, heavy lifting.  Gradually increase walking distances.  Prescription given for Norco.  Follow-Up Instructions: Return in about 5 weeks (around 06/29/2017) for Dr Otelia SergeantNitka.   Orders:  No orders of the defined types were placed in this encounter.  Meds ordered this encounter  Medications  . DISCONTD: HYDROcodone-acetaminophen (NORCO) 10-325 MG tablet    Sig: Take 1-2 tablets by mouth every 6 (six) hours as needed.    Dispense:  40 tablet    Refill:  0  . HYDROcodone-acetaminophen (NORCO) 10-325 MG tablet    Sig: Take 1-2 tablets by mouth every 6 (six) hours as needed.    Dispense:  40 tablet    Refill:  0    Imaging: No results found.  PMFS History: Patient Active Problem List   Diagnosis Date Noted  . HNP (herniated nucleus pulposus), lumbar 05/17/2017  . Acute back pain with sciatica, right 02/14/2017   Past Medical History:  Diagnosis Date  . History of kidney stones     History reviewed. No pertinent family history.  Past Surgical History:  Procedure Laterality Date  . LUMBAR LAMINECTOMY/DECOMPRESSION MICRODISCECTOMY N/A 05/17/2017   Procedure: RIGHT Lumbar four-five , RIGHT Lumbar five-Sacrum one , MICRODISCECTOMY (2 LEVEL) Lumbar-four-five, Lumbar five-Sacrum one laminectomy;  Surgeon: Eldred MangesYates, Mark C, MD;  Location: [facility] OR;  Service: Orthopedics;  Laterality: N/A;   Social History   Occupational History  . Not on file    Tobacco Use  . Smoking status: Never Smoker  . Smokeless tobacco: Current User    Types: Chew  Substance and Sexual Activity  . Alcohol use: Yes    Comment: socially  . Drug use: No  . Sexual activity: Not on file   Exam Very pleasant white male alert and oriented in no acute distress.  Surgical incision looks good.  Steri-Strips intact.  No drainage or signs of infection.  Neurovascular intact.  Ambulating well.

## 2017-05-25 NOTE — Telephone Encounter (Signed)
Appt was cancelled per pt.

## 2017-05-25 NOTE — Telephone Encounter (Signed)
Patient would like to cancel appt for 05/30/17

## 2017-05-28 NOTE — Discharge Summary (Signed)
Patient ID: Nathan BaleStephen Matthew Vanderlinde MRN: [id number] DOB/AGE: Mar 09, 1975 42 y.o.  Admit date: 05/17/2017 Discharge date: 05/28/2017  Admission Diagnoses:  Active Problems:   HNP (herniated nucleus pulposus), lumbar   Discharge Diagnoses:  Active Problems:   HNP (herniated nucleus pulposus), lumbar  status post Procedure(s): RIGHT Lumbar four-five , RIGHT Lumbar five-Sacrum one , MICRODISCECTOMY (2 LEVEL) Lumbar-four-five, Lumbar five-Sacrum one laminectomy  Past Medical History:  Diagnosis Date  . History of kidney stones     Surgeries: Procedure(s): RIGHT Lumbar four-five , RIGHT Lumbar five-Sacrum one , MICRODISCECTOMY (2 LEVEL) Lumbar-four-five, Lumbar five-Sacrum one laminectomy on 05/17/2017   Consultants:   Discharged Condition: Improved  Hospital Course: Nathan BaleStephen Matthew Cortez is an 42 y.o. male who was admitted 05/17/2017 for operative treatment of lumbar HNP. Patient failed conservative treatments (please see the history and physical for the specifics) and had severe unremitting pain that affects sleep, daily activities and work/hobbies. After pre-op clearance, the patient was taken to the operating room on 05/17/2017 and underwent  Procedure(s): RIGHT Lumbar four-five , RIGHT Lumbar five-Sacrum one , MICRODISCECTOMY (2 LEVEL) Lumbar-four-five, Lumbar five-Sacrum one laminectomy.    Patient was given perioperative antibiotics:  Anti-infectives (From admission, onward)   Start     Dose/Rate Route Frequency Ordered Stop   05/17/17 2000  ceFAZolin (ANCEF) IVPB 2g/100 mL premix     2 g 200 mL/hr over 30 Minutes Intravenous Every 8 hours 05/17/17 1647 05/18/17 0521   05/17/17 0945  ceFAZolin (ANCEF) IVPB 2g/100 mL premix     2 g 200 mL/hr over 30 Minutes Intravenous On call to O.R. 05/17/17 0936 05/17/17 1237       Patient was given sequential compression devices and early ambulation to prevent DVT.   Patient benefited maximally from hospital stay and there were no  complications. At the time of discharge, the patient was urinating/moving their bowels without difficulty, tolerating a regular diet, pain is controlled with oral pain medications and they have been cleared by PT/OT.   Recent vital signs: No data found.   Recent laboratory studies: No results for input(s): WBC, HGB, HCT, PLT, NA, K, CL, CO2, BUN, CREATININE, GLUCOSE, INR, CALCIUM in the last 72 hours.  Invalid input(s): PT, 2   Discharge Medications:   Allergies as of 05/18/2017   No Known Allergies     Medication List    TAKE these medications   methocarbamol 500 MG tablet Commonly known as:  ROBAXIN Take 1 tablet (500 mg total) by mouth every 6 (six) hours as needed for muscle spasms.   oxyCODONE-acetaminophen 5-325 MG tablet Commonly known as:  PERCOCET/ROXICET Take 1-2 tablets by mouth every 4 (four) hours as needed for severe pain.       Diagnostic Studies: Dg Lumbar Spine 1 View  Result Date: 05/17/2017 CLINICAL DATA:  Two level microdiscectomy. EXAM: LUMBAR SPINE - 1 VIEW COMPARISON:  03/01/2017. FINDINGS: Cross-table lateral radiograph 1 demonstrates a angled probe inferiorly directed most closely toward L5-S1. There is an additional instrument representing a straight clamp or hemostat more superiorly, directed most closely toward L4-5. IMPRESSION: Intraoperative localization as described. The vertebral bodies have been annotated on the radiograph as correlated with the prior MR, to aid in intraoperative localization. Electronically Signed   By: Elsie StainJohn T Curnes M.D.   On: 05/17/2017 19:28      Follow-up Information    Eldred MangesYates, Mark C, MD Follow up in 1 week(s).   Specialty:  Orthopedic Surgery Contact information: [address] [place]  [address] [phone]           Discharge Plan:  discharge to home  Disposition:     Signed: Zonia Kief  05/28/2017, 12:13 PM

## 2017-05-30 ENCOUNTER — Encounter (INDEPENDENT_AMBULATORY_CARE_PROVIDER_SITE_OTHER): Payer: BLUE CROSS/BLUE SHIELD | Admitting: Physical Medicine and Rehabilitation

## 2017-06-30 ENCOUNTER — Ambulatory Visit (INDEPENDENT_AMBULATORY_CARE_PROVIDER_SITE_OTHER): Payer: BLUE CROSS/BLUE SHIELD | Admitting: Orthopaedic Surgery

## 2017-06-30 ENCOUNTER — Ambulatory Visit (INDEPENDENT_AMBULATORY_CARE_PROVIDER_SITE_OTHER): Payer: BLUE CROSS/BLUE SHIELD | Admitting: Specialist

## 2017-06-30 ENCOUNTER — Encounter (INDEPENDENT_AMBULATORY_CARE_PROVIDER_SITE_OTHER): Payer: Self-pay

## 2017-07-03 ENCOUNTER — Ambulatory Visit (INDEPENDENT_AMBULATORY_CARE_PROVIDER_SITE_OTHER): Payer: BLUE CROSS/BLUE SHIELD | Admitting: Orthopaedic Surgery

## 2017-07-03 ENCOUNTER — Encounter (INDEPENDENT_AMBULATORY_CARE_PROVIDER_SITE_OTHER): Payer: Self-pay | Admitting: Orthopaedic Surgery

## 2017-07-03 VITALS — BP 137/98 | HR 66 | Ht 71.0 in | Wt 215.0 lb

## 2017-07-03 DIAGNOSIS — Z9889 Other specified postprocedural states: Secondary | ICD-10-CM | POA: Insufficient documentation

## 2017-07-03 NOTE — Progress Notes (Signed)
   Post-Op Visit Note   Patient: Nathan Cortez           Date of Birth: [birth year]           MRN: [id number] Visit Date: 07/03/2017 PCP: Waneta Martins, MD   Assessment & Plan: Post right two-level microdiscectomy L4-5 L5-S1.  Incisions well-healed leg is strong no isolated motor weakness.  He still has some numbness.  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  He will work on his rehab program.  Office follow-up PRN he is happy with the surgical result.  Chief Complaint:  Chief Complaint  Patient presents with  . Lower Back - Routine Post Op, Follow-up   Visit Diagnoses:  1. Status post lumbar microdiscectomy     Plan: Work resumption 07/24/2017.  Office follow-up PRN.  Follow-Up Instructions: Return if symptoms worsen or fail to improve.   Orders:  No orders of the defined types were placed in this encounter.  No orders of the defined types were placed in this encounter.   Imaging: No results found.  PMFS History: Patient Active Problem List   Diagnosis Date Noted  . HNP (herniated nucleus pulposus), lumbar 05/17/2017  . Acute back pain with sciatica, right 02/14/2017   Past Medical History:  Diagnosis Date  . History of kidney stones     No family history on file.  Past Surgical History:  Procedure Laterality Date  . LUMBAR LAMINECTOMY/DECOMPRESSION MICRODISCECTOMY N/A 05/17/2017   Procedure: RIGHT Lumbar four-five , RIGHT Lumbar five-Sacrum one , MICRODISCECTOMY (2 LEVEL) Lumbar-four-five, Lumbar five-Sacrum one laminectomy;  Surgeon: Eldred Manges, MD;  Location: [facility] OR;  Service: Orthopedics;  Laterality: N/A;   Social History   Occupational History  . Not on file  Tobacco Use  . Smoking status: Never Smoker  . Smokeless tobacco: Current User    Types: Chew  Substance and Sexual Activity  . Alcohol use: Yes    Comment: socially  . Drug use: No  . Sexual activity: Not on file

## 2018-09-18 IMAGING — CR DG LUMBAR SPINE 1V
1 series · 1 of 1 positions shown · non-contrast
Comparison: 03/01/2017.

CLINICAL DATA: Two level microdiscectomy.

EXAM:
LUMBAR SPINE - 1 VIEW

[xtable lateral]
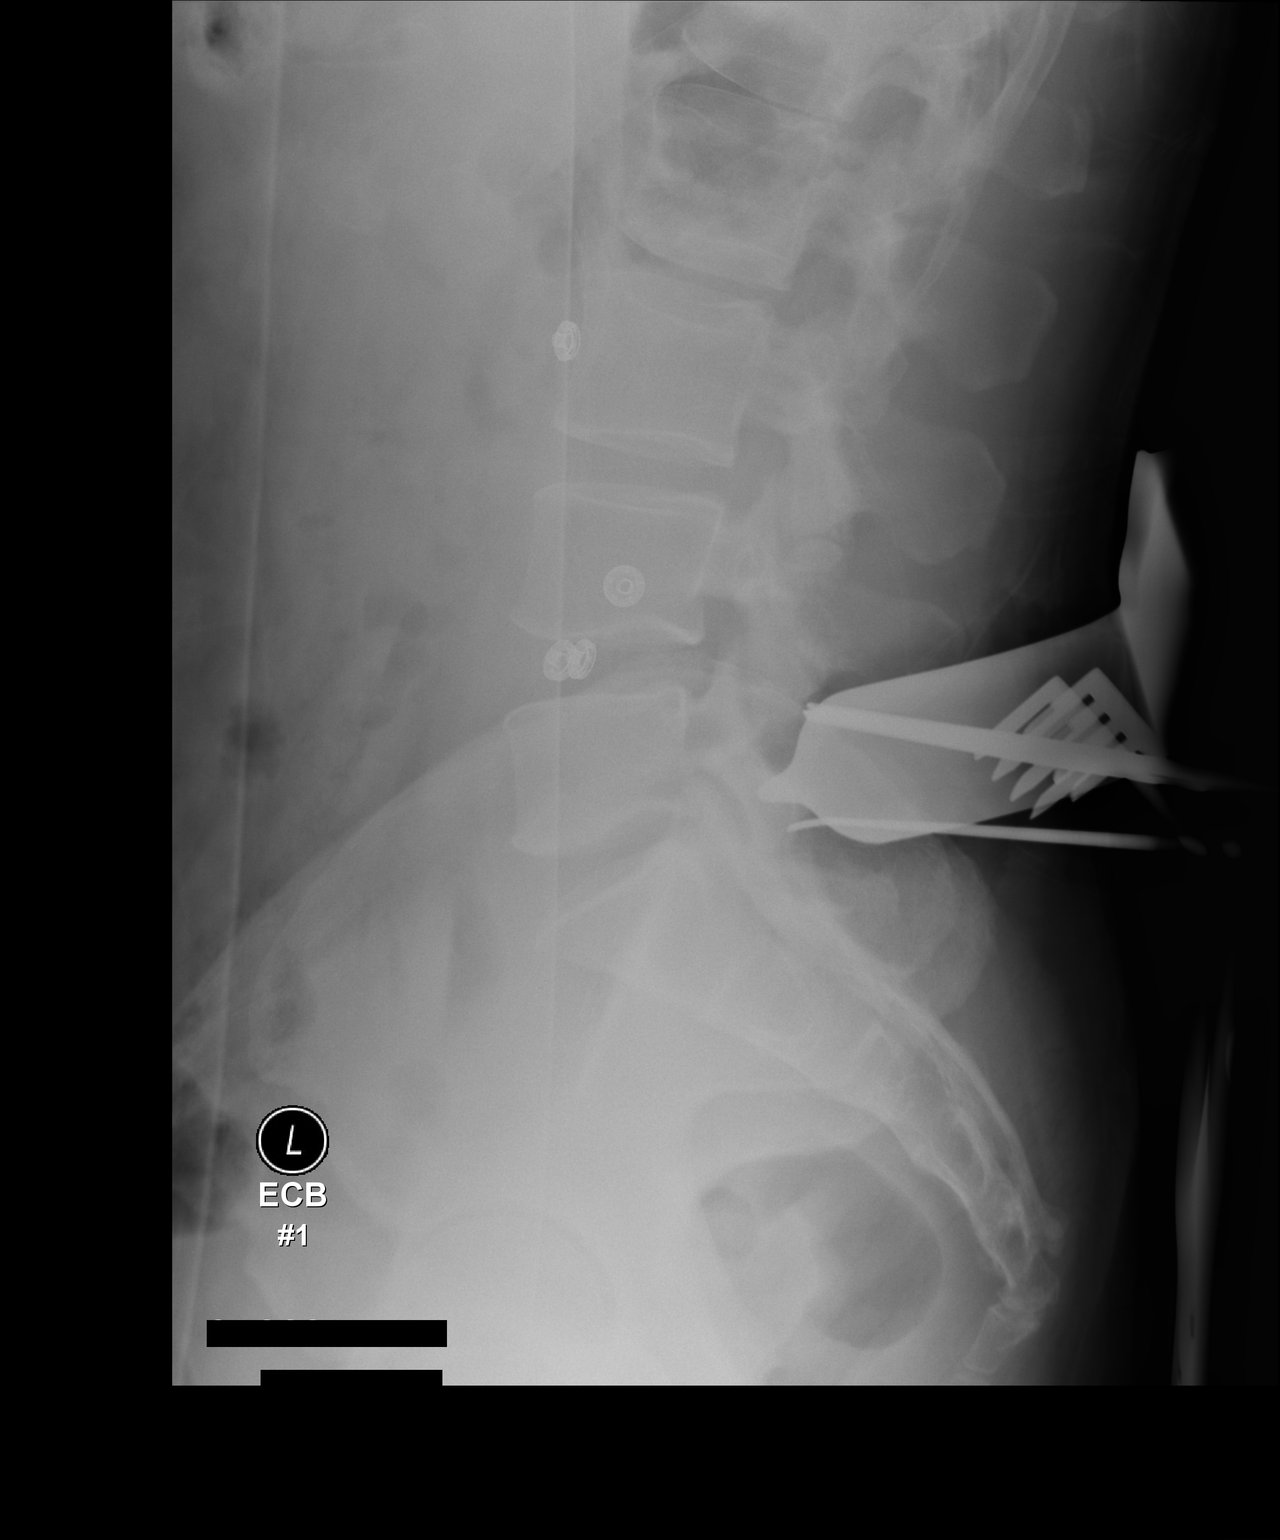

[1 of 1 positions shown; findings below may reference images not displayed]

FINDINGS: Cross-table lateral radiograph 1 demonstrates a angled probe
inferiorly directed most closely toward L5-S1. There is an
additional instrument representing a straight clamp or hemostat more
superiorly, directed most closely toward L4-5.
IMPRESSION: Intraoperative localization as described. The vertebral bodies have
been annotated on the radiograph as correlated with the prior MR, to
aid in intraoperative localization.
